# Patient Record
Sex: Female | Born: 2000 | Hispanic: Yes | Marital: Single | Smoking: Never smoker
Health system: Southern US, Community
[De-identification: ages and names within clinical notes are randomized; demographics above are authoritative.]

---

## 2020-03-04 ENCOUNTER — Encounter: Payer: Self-pay | Admitting: Obstetrics and Gynecology

## 2020-03-04 ENCOUNTER — Other Ambulatory Visit: Payer: Self-pay

## 2020-03-04 ENCOUNTER — Observation Stay
Admission: EM | Admit: 2020-03-04 | Discharge: 2020-03-05 | Disposition: A | Discharge status: Home / Self Care | Payer: Self-pay | Attending: Obstetrics and Gynecology | Admitting: Obstetrics and Gynecology

## 2020-03-04 DIAGNOSIS — O4703 False labor before 37 completed weeks of gestation, third trimester: Secondary | ICD-10-CM | POA: Diagnosis present

## 2020-03-04 DIAGNOSIS — Z3A28 28 weeks gestation of pregnancy: Secondary | ICD-10-CM | POA: Insufficient documentation

## 2020-03-04 DIAGNOSIS — O4693 Antepartum hemorrhage, unspecified, third trimester: Secondary | ICD-10-CM | POA: Diagnosis present

## 2020-03-04 DIAGNOSIS — Z8619 Personal history of other infectious and parasitic diseases: Secondary | ICD-10-CM | POA: Diagnosis not present

## 2020-03-04 MED ORDER — LACTATED RINGERS IV BOLUS
1000.0000 mL | Freq: Once | INTRAVENOUS | Status: AC
  Administered 2020-03-04: 1000 mL via INTRAVENOUS

## 2020-03-04 NOTE — OB Triage Note (Signed)
Pt presents to labor and delivery c/o mild bleeding that started an hour ago. Pt reports more bleeding when using the restroom as well. Pt denies any pain. Pt reports last intercourse was today earlier this afternoon. Pt denies pain and LOF but reports white discharge. Pt reports a little burning when urinating. Pt reports positive fetal movement. Pt reports that [place] prenatal [place] in [place] and is just here visiting. Pt temp was 99.3. All other VSS. Fetal monitors applied at this time. Will continue to monitor.

## 2020-03-05 ENCOUNTER — Observation Stay: Payer: Self-pay

## 2020-03-05 ENCOUNTER — Encounter: Payer: Self-pay | Admitting: Obstetrics and Gynecology

## 2020-03-05 DIAGNOSIS — O4693 Antepartum hemorrhage, unspecified, third trimester: Secondary | ICD-10-CM | POA: Diagnosis present

## 2020-03-05 DIAGNOSIS — Z8619 Personal history of other infectious and parasitic diseases: Secondary | ICD-10-CM

## 2020-03-05 DIAGNOSIS — O4703 False labor before 37 completed weeks of gestation, third trimester: Secondary | ICD-10-CM | POA: Diagnosis present

## 2020-03-05 HISTORY — DX: Personal history of other infectious and parasitic diseases: Z86.19

## 2020-03-05 LAB — URINALYSIS, ROUTINE W REFLEX MICROSCOPIC
Bilirubin Urine: NEGATIVE
Glucose, UA: NEGATIVE mg/dL
Ketones, ur: NEGATIVE mg/dL
Nitrite: NEGATIVE
Protein, ur: NEGATIVE mg/dL
RBC / HPF: >50 RBC/hpf — ABNORMAL HIGH (ref 0–5)
Specific Gravity, Urine: 1.005 (ref 1.005–1.030)
pH: 7 (ref 5.0–8.0)

## 2020-03-05 LAB — WET PREP, GENITAL
Clue Cells Wet Prep HPF POC: NONE SEEN
Sperm: NONE SEEN
Trich, Wet Prep: NONE SEEN
Yeast Wet Prep HPF POC: NONE SEEN

## 2020-03-05 LAB — CHLAMYDIA/NGC RT PCR (ARMC ONLY)
Chlamydia Tr: NOT DETECTED
N gonorrhoeae: NOT DETECTED

## 2020-03-05 MED ORDER — LACTATED RINGERS IV SOLN: INTRAVENOUS | Status: DC

## 2020-03-05 MED ORDER — HYDROXYZINE HCL 25 MG PO TABS
25.0000 mg | ORAL_TABLET | Freq: Once | ORAL | Status: AC
  Administered 2020-03-05: 25 mg via ORAL
  Filled 2020-03-05 (×2): qty 1

## 2020-03-05 MED ORDER — TERBUTALINE SULFATE 1 MG/ML IJ SOLN
0.2500 mg | INTRAMUSCULAR | Status: DC | PRN
  Administered 2020-03-05: 0.25 mg via SUBCUTANEOUS
  Filled 2020-03-05: qty 1

## 2020-03-05 MED ORDER — BETAMETHASONE SOD PHOS & ACET 6 (3-3) MG/ML IJ SUSP
12.0000 mg | Freq: Once | INTRAMUSCULAR | Status: AC
  Administered 2020-03-05: 12 mg via INTRAMUSCULAR
  Filled 2020-03-05: qty 5

## 2020-03-05 NOTE — Final Progress Note (Signed)
L&D OB Triage Note  HPI:  Becky Becker is a 20 y.o. G1P0 female at [redacted]w[redacted]d, Estimated Date of Delivery: 05/27/20 who presents for complaints of vaginal bleeding with onset around 1:30 pm last night. Notes that the bleeding was heavy, requiring use of a pad.  She denies contractions, leakage of fluid.  She endorses fetal movement. She does report intercourse last afternoon.    Patient is visiting from [place], receives [facility] there at [employer].  She notes her pregnancy has been uneventful thus far.   OB History  Gravida Para Term Preterm AB Living  1            SAB IAB Ectopic Multiple Live Births               # Outcome Date GA Lbr Len/2nd Weight Sex Delivery Anes PTL Lv  1 Current             Patient Active Problem List   Diagnosis Date Noted  . Vaginal bleeding in pregnancy, third trimester 03/05/2020  . History of chlamydia 03/05/2020  . History of gonorrhea 03/05/2020  . Preterm uterine contractions in third trimester, antepartum 03/05/2020    Past Medical History:  Diagnosis Date  . History of chlamydia 03/05/2020   2019 and 2021 (during pregnancy)  . History of gonorrhea 03/05/2020   2021 (during pregnancy)    No current facility-administered medications on file prior to encounter.   Current Outpatient Medications on File Prior to Encounter  Medication Sig Dispense Refill  . Prenatal Vit-Fe Fumarate-FA (MULTIVITAMIN-PRENATAL) 27-0.8 MG TABS tablet Take 1 tablet by mouth daily at 12 noon.      No Known Allergies   ROS:  General:Not Present- Fever, Weight Loss and Weight Gain. Skin:Not Present- Rash. HEENT:Not Present- Blurred Vision, Headache and Bleeding Gums. Respiratory:Not Present- Difficulty Breathing. Breast:Not Present- Breast Mass. Cardiovascular:Not Present- Chest Pain, Elevated Blood Pressure, Fainting / Blacking Out and Shortness of Breath. Gastrointestinal:Not Present- Abdominal Pain, Constipation, Nausea and  Vomiting. Female Genitourinary:Not Present- Frequency, Painful Urination, Pelvic Pain, Vaginal Discharge, Contractions, regular, Fetal Movements Decreased, Urinary Complaints and Vaginal Fluid.  Positive for vaginal bleeding Musculoskeletal:Not Present- Back Pain and Leg Cramps. Neurological:Not Present- Dizziness. Psychiatric:Not Present- Depression.     Physical Exam:  Blood pressure 134/84, pulse 90, temperature 99.3 F (37.4 C), temperature source Oral, resp. rate 18, height 5\' 6"  (1.676 m), weight 62.6 kg, last menstrual period 08/21/2019, SpO2 99 %. General appearance: alert and no distress Abdomen: soft, non-tender; bowel sounds normal; no masses,  no organomegaly Pelvic: external genitalia normal with scant blood on perineum, rectovaginal septum normal.  Vagina with small amount of dark red blood (~ tablespoon amount in vaginal vault).  Cervix normal appearing, no lesions and no motion tenderness, visibly ~ 0.5 cm dilated.   Extremities: extremities normal, atraumatic, no cyanosis or edema  Neurologic: Grossly intact   NST INTERPRETATION: Indications: rule out uterine contractions  Mode: External Baseline Rate (A): 140 bpm (fht) Variability: Moderate Accelerations: 15 x 15 Decelerations: Variable     Contraction Frequency (min): 3-4 (some detectable by patient but not painful)  Impression: reactive   Labs:  Results for orders placed or performed during the hospital encounter of 03/04/20  Wet prep, genital   Specimen: Vaginal  Result Value Ref Range   Yeast Wet Prep HPF POC NONE SEEN NONE SEEN   Trich, Wet Prep NONE SEEN NONE SEEN   Clue Cells Wet Prep HPF POC NONE SEEN NONE SEEN  WBC, Wet Prep HPF POC FEW (A) NONE SEEN   Sperm NONE SEEN   Chlamydia/NGC rt PCR ([facility] only)   Specimen: Urine  Result Value Ref Range   Specimen source GC/Chlam URINE, RANDOM    Chlamydia Tr NOT DETECTED NOT DETECTED   N gonorrhoeae NOT DETECTED NOT DETECTED  Urinalysis, Routine  w reflex microscopic Urine, Clean Catch  Result Value Ref Range   Color, Urine YELLOW (A) YELLOW   APPearance CLEAR (A) CLEAR   Specific Gravity, Urine 1.005 1.005 - 1.030   pH 7.0 5.0 - 8.0   Glucose, UA NEGATIVE NEGATIVE mg/dL   Hgb urine dipstick LARGE (A) NEGATIVE   Bilirubin Urine NEGATIVE NEGATIVE   Ketones, ur NEGATIVE NEGATIVE mg/dL   Protein, ur NEGATIVE NEGATIVE mg/dL   Nitrite NEGATIVE NEGATIVE   Leukocytes,Ua MODERATE (A) NEGATIVE   RBC / HPF >50 (H) 0 - 5 RBC/hpf   WBC, UA 21-50 0 - 5 WBC/hpf   Bacteria, UA RARE (A) NONE SEEN   Squamous Epithelial / LPF 0-5 0 - 5     Imaging:  US OB Limited CLINICAL DATA:  Bleeding, rule out abruption. Assigned gestational age of [redacted] weeks and 1 day. Last menstrual period 05/27/2020.  EXAM: LIMITED OBSTETRIC ULTRASOUND  FINDINGS: Number of Fetuses: 1  Heart Rate:  157 bpm  Movement: Yes  Presentation: Cephalic with spine posterior.  Placental Location: Anterior  Previa: No  Amniotic Fluid (Subjective):  Within normal limits.  AFI: 11.7 cm  BPD: 7.1 cm 28 w  5 d  MATERNAL FINDINGS:  Cervix:  Not visualized.  Uterus/Adnexae: No abnormality visualized.  Intrauterine gestational sac: Single  IMPRESSION: 1. No definite placental abruption or placenta previa identified. 2. Single intrauterine pregnancy with gestational age of [redacted] weeks and 5 days concordant with previously assigned gestational age of [redacted] weeks and 1 day. 3. Please note cervical length was not assessed as it is not visualized.  This exam is performed on an emergent basis and does not comprehensively evaluate fetal size, dating, or anatomy; follow-up complete OB US should be considered if further fetal assessment is warranted.  Electronically Signed   By: Tish Frederickson M.D.   On: 03/05/2020 04:56     Assessment:  20 y.o. G1P0 at [redacted]w[redacted]d with:  1.  Vaginal bleeding in third trimester 2. Preterm contractions   Plan:  1. Patient with  preterm contractions and vaginal bleeding (has had several more episodes while in triage).  Contractions noted but not painful to patient.  Recent intercourse within 24 hrs so FFN not collected.  Wet prep negative. GC/Cl pending. Given IVF bolus and Vistaril with no resolution of contractions.  Ultrasound ordered that ruled out abruption/previa.  Amniotic fluid overall normal. Will give course of antenatal steroids. Given a dose of terbutaline at 0500 which decreased contraction frequency to ~ q 20 minutes. Extended monitoring performed during admission for ~ 12 hours.  A period of 20 minutes starting around 0630 with intermittent variable decelerations, but otherwise tracing overall reassuring.  2. Records reviewed from OB/GYN in [place], normal prenatal course, labs reviewed, patient with h/o cervicitis (+ Chlamydia, Gonorrhea, yeast and BV) at [redacted] weeks gestation which was treated, but otherwise no issues. Is O positive, does not require Rhogam. 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  Had genetic counseling completed 2 weeks ago, previous normal genetic screening.  Is scheduled for a fetal echo in March.  3.Patient more stable and allowed to d/c home. Advised on  pelvic rest until next appointment with her OB/GYN. Will need to return for second dose of antenatal steroids tomorrow morning at 0800.  Patient and partner plan to return to [place] in 2-3 days (driving). Advised on frequent breaks and stretching to reduce the risk of blood clots.     Hildred Laser, MD [facility]

## 2020-03-05 NOTE — Discharge Instructions (Signed)
Hemorragia vaginal durante el tercer trimestre del embarazo Vaginal Bleeding During Pregnancy, Third Trimester Durante los primeros meses del [place], es comn tener una pequea hemorragia o manchado de la vagina. A veces, la hemorragia es normal y no representa un problema, pero, en algunas ocasiones, es un sntoma de algo grave. En el tercer trimestre, puede producirse una hemorragia normal:  Debido a cambios en los vasos sanguneos.  Cuando tiene [employer].  Cuando se le realizan exmenes plvicos. Durante este tiempo, algunas cosas anormales pueden causar hemorragias. Estas incluyen:  Infeccin en el tero.  Crecimientos en la parte ms baja del tero (cuello uterino). Estos crecimientos tambin se denominan plipos.  Problemas con la placenta. La placenta puede: ? Bloquear la abertura del cuello uterino. ? Desprenderse del tero. ? Crecer en el interior del msculo del tero.  Trabajo de parto temprano. Informe al mdico de inmediato si tiene algn tipo de hemorragia de la vagina. Siga estas instrucciones en su casa: Controle su hemorragia  Controle su afeccin para detectar cualquier cambio. Informe al mdico si est preocupada por algo.  Intente saber qu causa su hemorragia. Hgase estas preguntas: ? La hemorragia comienza por s sola? ? La hemorragia comienza despus de hacer algo, como tener relaciones sexuales o someterse a un examen plvico?  Utilice un diario para escribir las cosas que observa sobre su hemorragia. [place] en su diario: ? Si la hemorragia fluye libremente sin detenerse, o si comienza y se detiene, y [place] comienza de [place]. ? Si la hemorragia es intensa o Veterinary surgeon. ? Cuntas compresas [place] al da y cunta sangre hay en ellas.  Informe al mdico si elimina tejido. Es posible que el mdico quiera verlo.   Actividad  Siga las instrucciones de su mdico con respecto al grado de actividad que puede Education officer, environmental. El mdico puede  recomendarle: ? Quedarse en cama y levantarse nicamente para ir al bao. ? Continuar realizando tareas livianas.  Pregntele al mdico si puede conducir sin correr [employer].  No levante objetos que pesen ms de 10libras (4.5kg) o que sean ms pesados de lo que le indicaron.  No tenga relaciones sexuales hasta que el mdico le diga que es seguro.  De ser necesario, organcese para que alguien la ayude con las actividades habituales. Medicamentos  Use los medicamentos de venta libre y los recetados solamente como se lo haya indicado el mdico.  No tome aspirina. Puede ocasionar hemorragias. Indicaciones generales  No use tampones.  No se haga duchas vaginales.  Cumpla con todas las visitas de seguimiento. Comunquese con un mdico si:  Tiene una hemorragia vaginal durante el embarazo.  Tiene clicos.  Tiene fiebre. Solicite ayuda de inmediato si:  Tiene clicos abdominales muy intensos.  Siente un dolor muy intenso en la espalda o en el vientre (abdomen).  Tiene una prdida de lquido abundante por la vagina.  La hemorragia empeora.  Elimina cogulos grandes o mucho tejido por la vagina.  Se siente dbil o siente que va a desvanecerse.  Pierde el conocimiento (se desmaya).  No siente los movimientos del beb, o el beb se mueve menos que lo habitual. Resumen  Una pequea cantidad de hemorragia durante el embarazo es normal. Algunas hemorragias pueden estar causadas por problemas ms graves.  Informe al mdico de inmediato si tiene algn tipo de hemorragia de la vagina.  Siga las indicaciones del mdico con respecto al grado de actividad que puede Education officer, environmental. Quiz necesite que alguien la ayude con las actividades habituales. Esta informacin no tiene  como fin reemplazar el consejo del mdico. Asegrese de hacerle al mdico cualquier pregunta que tenga. Document Revised: 11/25/2019 Document Reviewed: 11/25/2019 Elsevier Patient Education  2021 [employer].

## 2020-03-05 NOTE — Progress Notes (Signed)
Pt called RN to the bedside to ask for another peripad and help her to the bathroom. Pt peripad had a small amount of blood on it, however, there was a good amount when she wiped and the toilet looked full of blood. RN asked pt again if she was having any pain pt responded "no, but I feel my belly getting tight every few minutes". Provider made aware. New orders given at this time. Will continue to monitor.

## 2020-03-05 NOTE — Progress Notes (Signed)
Fetal monitors removed. Ultrasound at the bedside at this time. Will continue to monitor.

## 2020-03-05 NOTE — Progress Notes (Signed)
Provider at bedside at this time to examine patient.  Video interpreter Vernona Rieger present [phone]).

## 2020-03-05 NOTE — Progress Notes (Addendum)
Dr. Valentino Saxon at bedside to assess pt. Dr. Valentino Saxon assessed FHR and pt contractions. Education given by Dr. Valentino Saxon via interpreter. #[id number]. Pt to be d/c'd after pt eats breakfast. See orders.

## 2020-03-05 NOTE — Progress Notes (Signed)
Discharge instructions, prescriptions, education, and appointments given and explained. Pt verbalized understanding with no further questions. Pt educated to return tomorrow morning at 8am for second dose of betamethasone, pt verbalized understanding. No further questions. Pt wheeled to personal vehicle for d/c with S.O.

## 2020-03-06 ENCOUNTER — Observation Stay
Admission: RE | Admit: 2020-03-06 | Discharge: 2020-03-06 | Disposition: A | Discharge status: Home / Self Care | Payer: Self-pay | Attending: Obstetrics and Gynecology | Admitting: Obstetrics and Gynecology

## 2020-03-06 DIAGNOSIS — O360130 Maternal care for anti-D [Rh] antibodies, third trimester, not applicable or unspecified: Principal | ICD-10-CM | POA: Insufficient documentation

## 2020-03-06 DIAGNOSIS — Z3A30 30 weeks gestation of pregnancy: Secondary | ICD-10-CM | POA: Insufficient documentation

## 2020-03-06 MED ORDER — BETAMETHASONE SOD PHOS & ACET 6 (3-3) MG/ML IJ SUSP: 12.0000 mg | Freq: Once | INTRAMUSCULAR | Status: AC

## 2020-03-06 MED ORDER — BETAMETHASONE SOD PHOS & ACET 6 (3-3) MG/ML IJ SUSP
INTRAMUSCULAR | Status: AC
  Administered 2020-03-06: 12 mg via INTRAMUSCULAR
  Filled 2020-03-06: qty 5

## 2020-03-06 NOTE — Discharge Instructions (Signed)
Pt received 2 doses of Betamethasone on 03/05/2020 and 03/06/2020. No VB, LOF, and positive fetal movement.

## 2020-03-06 NOTE — Progress Notes (Signed)
Pt received 2nd dose of betamethasone today. Pt denies any vaginal bleeding, leaking of fluid or contractions, and states positive fetal movement. Pt discharged home per Logan Bores, MD order.  Pt stable and ambulatory. An After Visit Summary was printed and given to the patient. Discharge education completed with patient/family including follow up instructions, medication list, d/c activities limitations if indicated, with other d/c instructions as indicated by MD . Pt received labor and bleeding precautions. Patient able to verbalize understanding, all questions fully answered. Patient instructed to return to ED, call 911, or call MD for any changes in condition. Pt discharged home via personal vehicle with support person.

## 2022-07-19 IMAGING — US US OB LIMITED
1 series · 14 of 22 positions shown · non-contrast
Comparison: none

CLINICAL DATA: Bleeding, rule out abruption. Assigned gestational
age of 28 weeks and 1 day. Last menstrual period 05/27/2020.

EXAM:
LIMITED OBSTETRIC ULTRASOUND

[Series 1: ob us · 14 of 22 slices shown]
[im 1/22]
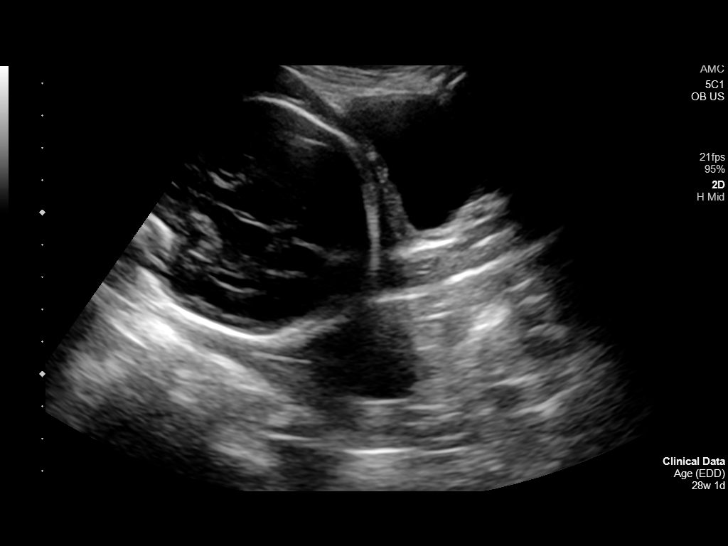
[im 3/22]
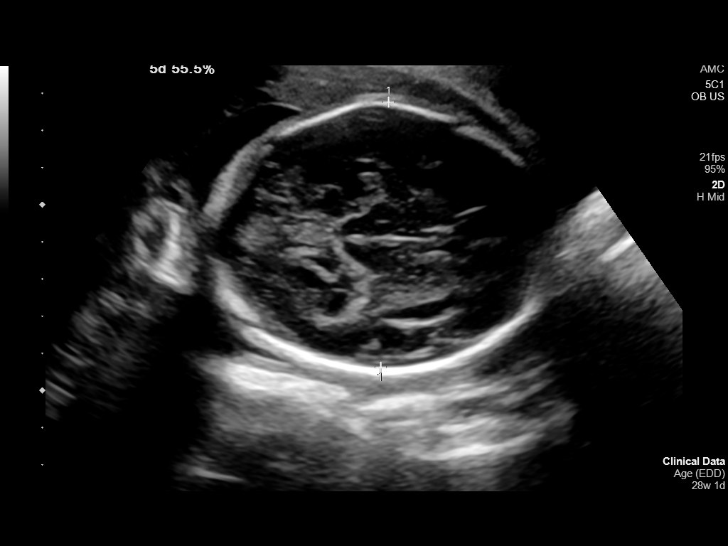
[im 4/22]
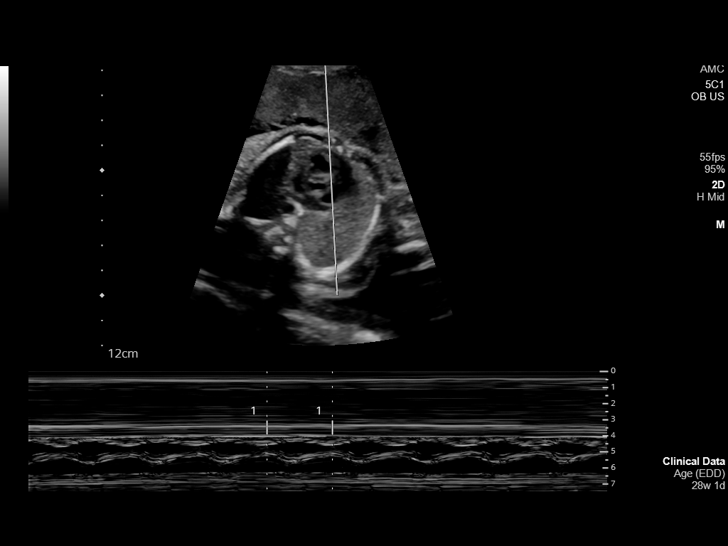
[im 6/22]
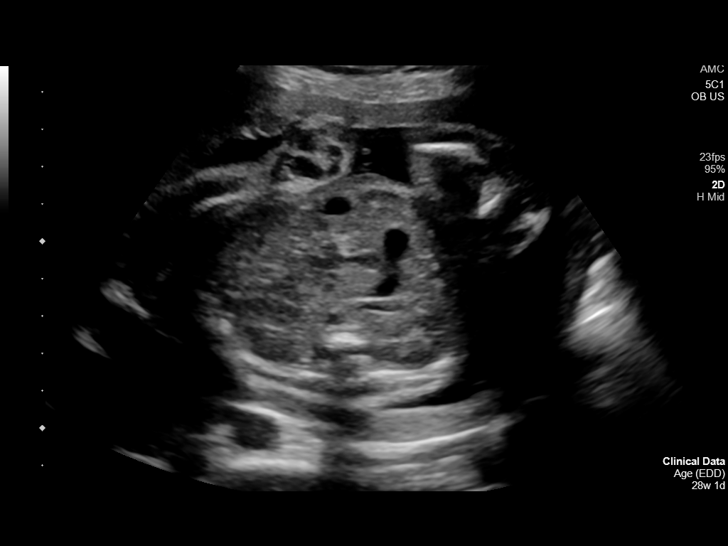
[im 8/22]
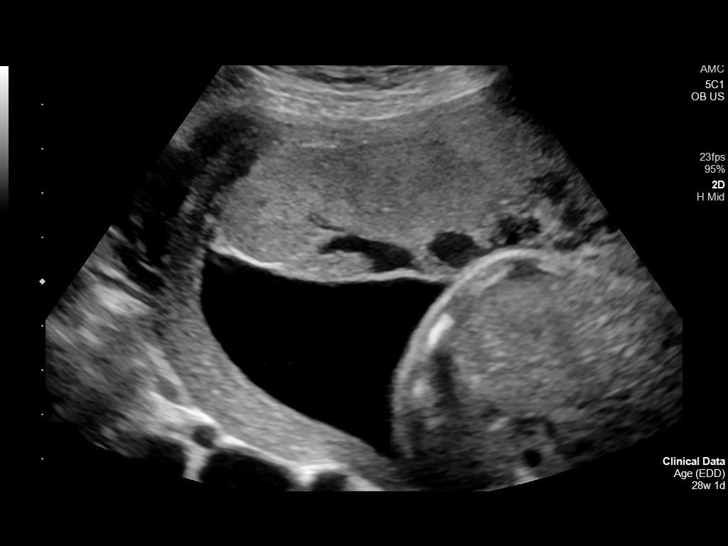
[im 9/22]
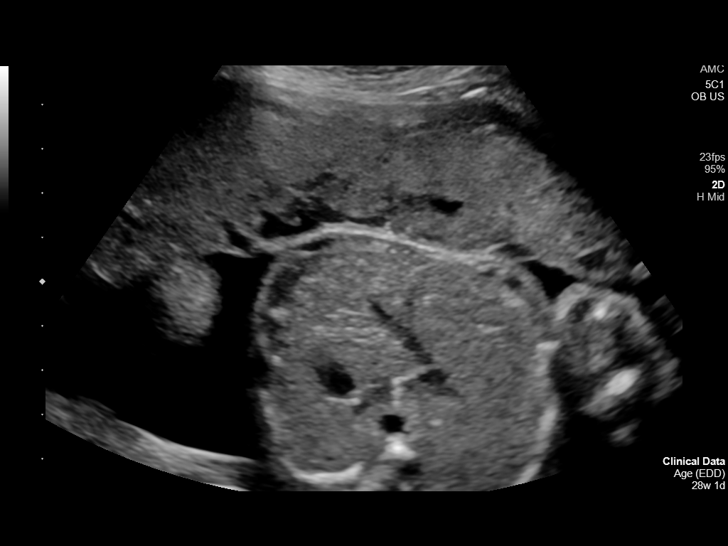
[im 11/22]
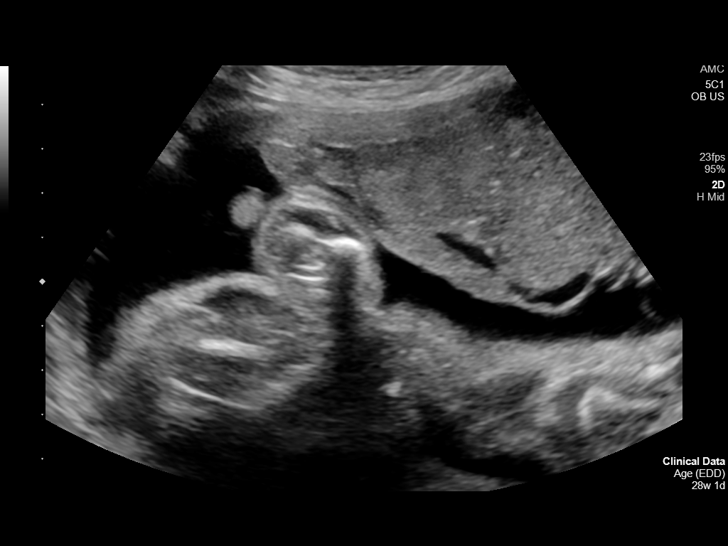
[im 12/22]
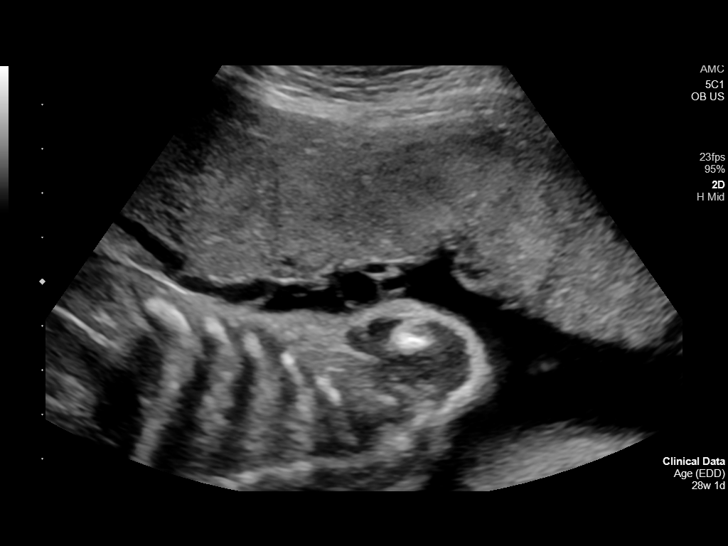
[im 14/22]
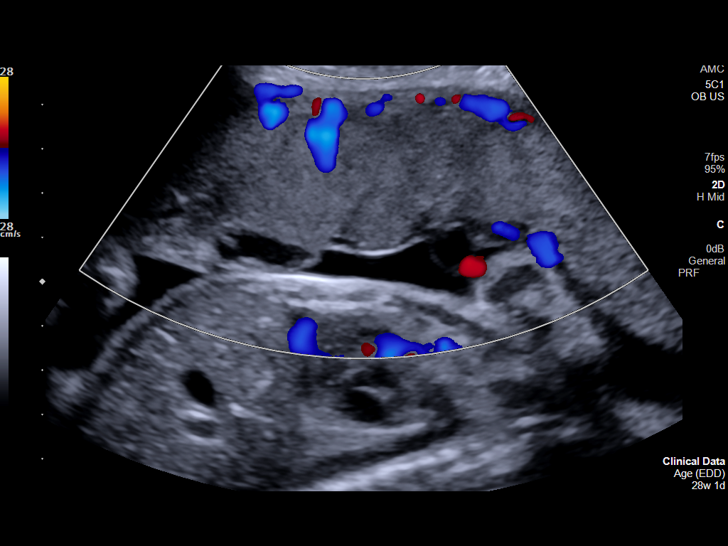
[im 15/22]
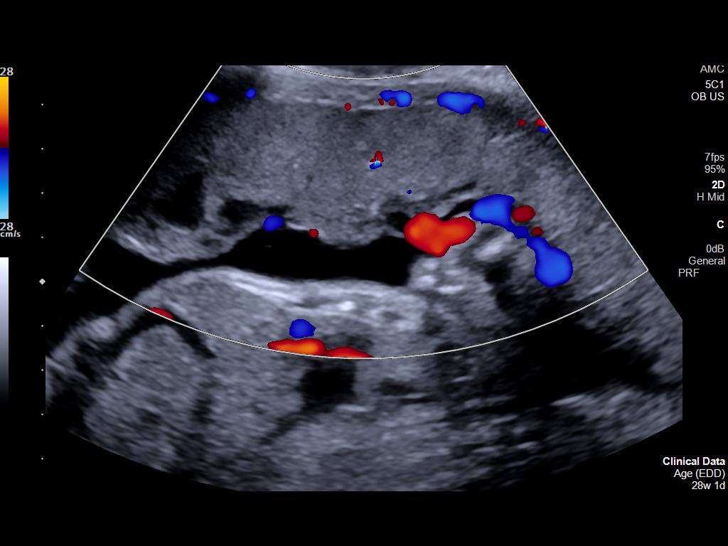
[im 17/22]
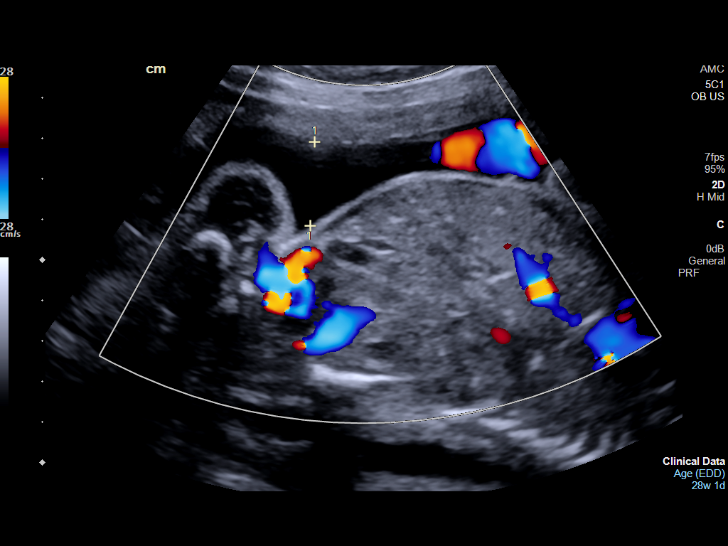
[im 19/22]
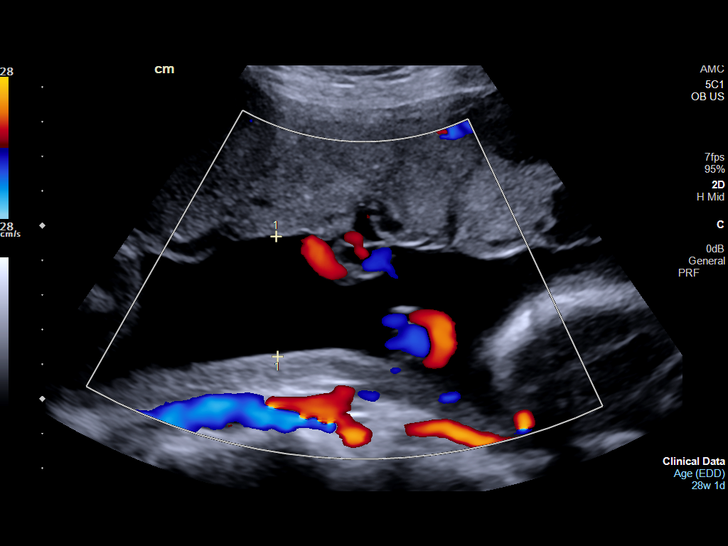
[im 20/22]
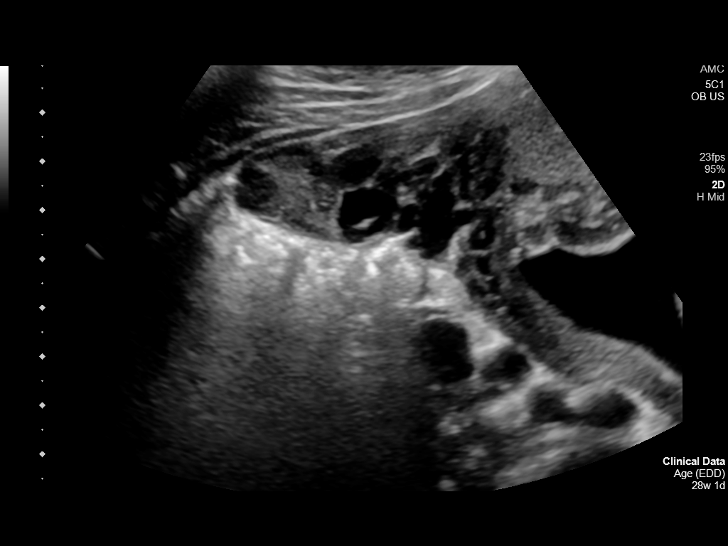
[im 22/22]
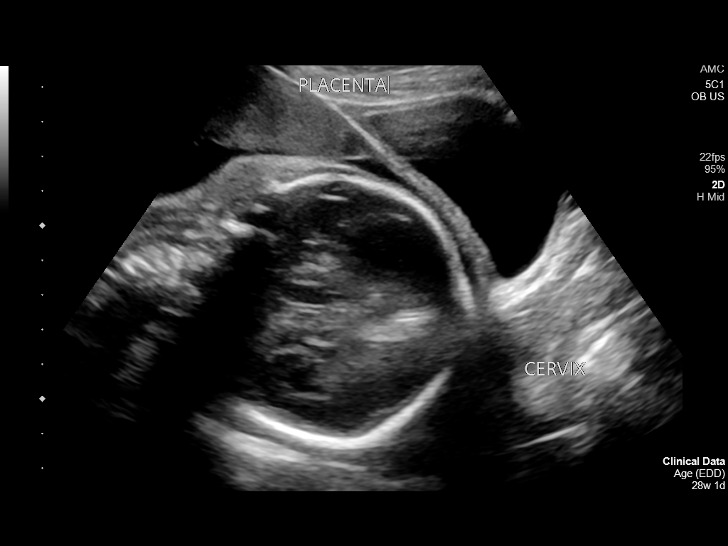

[14 of 22 positions shown; findings below may reference images not displayed]

FINDINGS: Number of Fetuses: 1

Heart Rate:  157 bpm

Movement: Yes

Presentation: Cephalic with spine posterior.

Placental Location: Anterior

Previa: No

Amniotic Fluid (Subjective):  Within normal limits.

AFI: 11.7 cm

BPD: 7.1 cm 28 w  5 d

MATERNAL FINDINGS:

Cervix:  Not visualized.

Uterus/Adnexae: No abnormality visualized.

Intrauterine gestational sac: Single
IMPRESSION: 1. No definite placental abruption or placenta previa identified.
2. Single intrauterine pregnancy with gestational age of 28 weeks
and 5 days concordant with previously assigned gestational age of 28
weeks and 1 day.
3. Please note cervical length was not assessed as it is not
visualized.

This exam is performed on an emergent basis and does not
comprehensively evaluate fetal size, dating, or anatomy; follow-up
complete OB US should be considered if further fetal assessment is
warranted.
